# Patient Record
Sex: Female | Born: 1960 | Race: White | Hispanic: No | Marital: Married | Smoking: Never smoker
Health system: Southern US, Community
[De-identification: ages and names within clinical notes are randomized; demographics above are authoritative.]

## PROBLEM LIST (undated history)

## (undated) DIAGNOSIS — A6000 Herpesviral infection of urogenital system, unspecified: Secondary | ICD-10-CM

## (undated) DIAGNOSIS — Z803 Family history of malignant neoplasm of breast: Secondary | ICD-10-CM

## (undated) DIAGNOSIS — C50911 Malignant neoplasm of unspecified site of right female breast: Secondary | ICD-10-CM

## (undated) DIAGNOSIS — M8580 Other specified disorders of bone density and structure, unspecified site: Secondary | ICD-10-CM

## (undated) DIAGNOSIS — K589 Irritable bowel syndrome without diarrhea: Secondary | ICD-10-CM

## (undated) DIAGNOSIS — K760 Fatty (change of) liver, not elsewhere classified: Secondary | ICD-10-CM

## (undated) DIAGNOSIS — J45909 Unspecified asthma, uncomplicated: Secondary | ICD-10-CM

## (undated) DIAGNOSIS — Z780 Asymptomatic menopausal state: Secondary | ICD-10-CM

## (undated) DIAGNOSIS — I10 Essential (primary) hypertension: Secondary | ICD-10-CM

## (undated) DIAGNOSIS — E7800 Pure hypercholesterolemia, unspecified: Secondary | ICD-10-CM

## (undated) DIAGNOSIS — F411 Generalized anxiety disorder: Secondary | ICD-10-CM

## (undated) DIAGNOSIS — M810 Age-related osteoporosis without current pathological fracture: Secondary | ICD-10-CM

## (undated) DIAGNOSIS — K219 Gastro-esophageal reflux disease without esophagitis: Secondary | ICD-10-CM

## (undated) DIAGNOSIS — E039 Hypothyroidism, unspecified: Secondary | ICD-10-CM

## (undated) HISTORY — DX: Herpesviral infection of urogenital system, unspecified: A60.00

## (undated) HISTORY — PX: NECK SURGERY: SHX720

## (undated) HISTORY — DX: Pure hypercholesterolemia, unspecified: E78.00

## (undated) HISTORY — DX: Irritable bowel syndrome, unspecified: K58.9

## (undated) HISTORY — DX: Unspecified asthma, uncomplicated: J45.909

## (undated) HISTORY — PX: ESOPHAGOGASTRODUODENOSCOPY: SHX1529

## (undated) HISTORY — DX: Generalized anxiety disorder: F41.1

## (undated) HISTORY — DX: Age-related osteoporosis without current pathological fracture: M81.0

## (undated) HISTORY — DX: Hypothyroidism, unspecified: E03.9

## (undated) HISTORY — PX: BREAST CYST ASPIRATION: SHX578

## (undated) HISTORY — DX: Gastro-esophageal reflux disease without esophagitis: K21.9

## (undated) HISTORY — DX: Family history of malignant neoplasm of breast: Z80.3

## (undated) HISTORY — DX: Asymptomatic menopausal state: Z78.0

## (undated) HISTORY — DX: Other specified disorders of bone density and structure, unspecified site: M85.80

## (undated) HISTORY — DX: Malignant neoplasm of unspecified site of right female breast: C50.911

## (undated) HISTORY — DX: Fatty (change of) liver, not elsewhere classified: K76.0

---

## 2003-02-01 ENCOUNTER — Ambulatory Visit (HOSPITAL_COMMUNITY): Admission: RE | Admit: 2003-02-01 | Discharge: 2003-02-01 | Payer: Self-pay | Admitting: Internal Medicine

## 2006-01-08 ENCOUNTER — Ambulatory Visit: Payer: Self-pay | Admitting: Unknown Physician Specialty

## 2006-01-21 ENCOUNTER — Ambulatory Visit: Payer: Self-pay | Admitting: Unknown Physician Specialty

## 2007-04-12 ENCOUNTER — Emergency Department: Payer: Self-pay | Admitting: Emergency Medicine

## 2007-08-06 ENCOUNTER — Ambulatory Visit (HOSPITAL_COMMUNITY): Admission: RE | Admit: 2007-08-06 | Discharge: 2007-08-06 | Payer: Self-pay | Admitting: Otolaryngology

## 2008-04-16 ENCOUNTER — Encounter: Admission: RE | Admit: 2008-04-16 | Discharge: 2008-04-16 | Payer: Self-pay | Admitting: Psychiatry

## 2009-04-27 ENCOUNTER — Encounter: Payer: Self-pay | Admitting: Allergy and Immunology

## 2009-05-01 ENCOUNTER — Encounter: Payer: Self-pay | Admitting: Allergy and Immunology

## 2009-06-01 ENCOUNTER — Encounter: Payer: Self-pay | Admitting: Allergy and Immunology

## 2009-07-01 ENCOUNTER — Encounter: Payer: Self-pay | Admitting: Allergy and Immunology

## 2009-11-14 ENCOUNTER — Ambulatory Visit: Payer: Self-pay | Admitting: Unknown Physician Specialty

## 2010-03-03 HISTORY — PX: COLONOSCOPY: SHX174
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
## 2010-11-13 ENCOUNTER — Ambulatory Visit: Payer: Self-pay | Admitting: Unknown Physician Specialty

## 2010-11-28 ENCOUNTER — Ambulatory Visit: Payer: Self-pay | Admitting: Unknown Physician Specialty

## 2010-11-28 LAB — BASIC METABOLIC PANEL
BUN: 11
CO2: 28
Calcium: 10.4
Chloride: 103
Creatinine, Ser: 0.94
GFR calc Af Amer: >60
GFR calc non Af Amer: >60
Glucose, Bld: 87
Potassium: 4.8
Sodium: 137

## 2010-11-28 LAB — CBC
HCT: 33.8 — ABNORMAL LOW
Hemoglobin: 11.9 — ABNORMAL LOW
MCHC: 35.3
MCV: 83.8
Platelets: 346
RBC: 4.04
RDW: 13.1
WBC: 5

## 2011-04-02 ENCOUNTER — Ambulatory Visit: Payer: Self-pay | Admitting: Internal Medicine

## 2011-04-09 ENCOUNTER — Other Ambulatory Visit: Payer: Self-pay | Admitting: Gastroenterology

## 2011-04-09 LAB — HCG, QUANTITATIVE, PREGNANCY: Beta Hcg, Quant.: 1 m[IU]/mL

## 2011-04-10 ENCOUNTER — Ambulatory Visit: Payer: Self-pay | Admitting: Gastroenterology

## 2014-10-13 ENCOUNTER — Other Ambulatory Visit: Payer: Self-pay | Admitting: Internal Medicine

## 2014-10-13 ENCOUNTER — Ambulatory Visit
Admission: RE | Admit: 2014-10-13 | Discharge: 2014-10-13 | Disposition: A | Discharge status: Home / Self Care | Payer: BC Managed Care – PPO | Source: Ambulatory Visit | Attending: Internal Medicine | Admitting: Internal Medicine

## 2014-10-13 DIAGNOSIS — R05 Cough: Secondary | ICD-10-CM

## 2014-10-13 DIAGNOSIS — R059 Cough, unspecified: Secondary | ICD-10-CM

## 2014-10-13 DIAGNOSIS — J45909 Unspecified asthma, uncomplicated: Secondary | ICD-10-CM | POA: Diagnosis not present

## 2014-10-13 DIAGNOSIS — R0989 Other specified symptoms and signs involving the circulatory and respiratory systems: Secondary | ICD-10-CM | POA: Diagnosis present

## 2015-12-22 ENCOUNTER — Emergency Department: Payer: BC Managed Care – PPO

## 2015-12-22 ENCOUNTER — Encounter: Payer: Self-pay | Admitting: Emergency Medicine

## 2015-12-22 ENCOUNTER — Emergency Department
Admission: EM | Admit: 2015-12-22 | Discharge: 2015-12-22 | Disposition: A | Discharge status: Home / Self Care | Payer: BC Managed Care – PPO | Attending: Emergency Medicine | Admitting: Emergency Medicine

## 2015-12-22 DIAGNOSIS — S161XXA Strain of muscle, fascia and tendon at neck level, initial encounter: Secondary | ICD-10-CM

## 2015-12-22 DIAGNOSIS — S39012A Strain of muscle, fascia and tendon of lower back, initial encounter: Secondary | ICD-10-CM | POA: Diagnosis not present

## 2015-12-22 DIAGNOSIS — Y92410 Unspecified street and highway as the place of occurrence of the external cause: Secondary | ICD-10-CM | POA: Insufficient documentation

## 2015-12-22 DIAGNOSIS — Y999 Unspecified external cause status: Secondary | ICD-10-CM | POA: Diagnosis not present

## 2015-12-22 DIAGNOSIS — R1084 Generalized abdominal pain: Secondary | ICD-10-CM

## 2015-12-22 DIAGNOSIS — Y939 Activity, unspecified: Secondary | ICD-10-CM | POA: Insufficient documentation

## 2015-12-22 DIAGNOSIS — S169XXA Unspecified injury of muscle, fascia and tendon at neck level, initial encounter: Secondary | ICD-10-CM | POA: Diagnosis present

## 2015-12-22 DIAGNOSIS — I10 Essential (primary) hypertension: Secondary | ICD-10-CM | POA: Diagnosis not present

## 2015-12-22 DIAGNOSIS — G8911 Acute pain due to trauma: Secondary | ICD-10-CM

## 2015-12-22 HISTORY — DX: Essential (primary) hypertension: I10

## 2015-12-22 LAB — COMPREHENSIVE METABOLIC PANEL WITH GFR
ALT: 14 U/L (ref 14–54)
AST: 19 U/L (ref 15–41)
Albumin: 3.9 g/dL (ref 3.5–5.0)
Alkaline Phosphatase: 71 U/L (ref 38–126)
Anion gap: 7 (ref 5–15)
BUN: 17 mg/dL (ref 6–20)
CO2: 27 mmol/L (ref 22–32)
Calcium: 9.3 mg/dL (ref 8.9–10.3)
Chloride: 100 mmol/L — ABNORMAL LOW (ref 101–111)
Creatinine, Ser: 0.98 mg/dL (ref 0.44–1.00)
GFR calc Af Amer: >60 mL/min
GFR calc non Af Amer: >60 mL/min
Glucose, Bld: 92 mg/dL (ref 65–99)
Potassium: 3.6 mmol/L (ref 3.5–5.1)
Sodium: 134 mmol/L — ABNORMAL LOW (ref 135–145)
Total Bilirubin: 0.7 mg/dL (ref 0.3–1.2)
Total Protein: 7.5 g/dL (ref 6.5–8.1)

## 2015-12-22 LAB — CBC WITH DIFFERENTIAL/PLATELET
Basophils Absolute: 0 K/uL (ref 0–0.1)
Basophils Relative: 1 %
Eosinophils Absolute: 0.3 K/uL (ref 0–0.7)
Eosinophils Relative: 5 %
HCT: 37.1 % (ref 35.0–47.0)
Hemoglobin: 12.6 g/dL (ref 12.0–16.0)
Lymphocytes Relative: 28 %
Lymphs Abs: 1.7 K/uL (ref 1.0–3.6)
MCH: 28.4 pg (ref 26.0–34.0)
MCHC: 33.9 g/dL (ref 32.0–36.0)
MCV: 84 fL (ref 80.0–100.0)
Monocytes Absolute: 0.6 K/uL (ref 0.2–0.9)
Monocytes Relative: 10 %
Neutro Abs: 3.4 K/uL (ref 1.4–6.5)
Neutrophils Relative %: 56 %
Platelets: 317 K/uL (ref 150–440)
RBC: 4.42 MIL/uL (ref 3.80–5.20)
RDW: 13.4 % (ref 11.5–14.5)
WBC: 6 K/uL (ref 3.6–11.0)

## 2015-12-22 MED ORDER — IOPAMIDOL (ISOVUE-300) INJECTION 61%
100.0000 mL | Freq: Once | INTRAVENOUS | Status: AC | PRN
  Administered 2015-12-22: 100 mL via INTRAVENOUS
  Filled 2015-12-22: qty 100

## 2015-12-22 MED ORDER — NAPROXEN 500 MG PO TABS: 500.0000 mg | ORAL_TABLET | Freq: Two times a day (BID) | ORAL | 0 refills | Status: DC

## 2015-12-22 MED ORDER — DIAZEPAM 2 MG PO TABS: 2.0000 mg | ORAL_TABLET | Freq: Three times a day (TID) | ORAL | 0 refills | Status: DC | PRN
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
## 2015-12-22 NOTE — Discharge Instructions (Signed)
Call and schedule a follow up appointment with Dr. Hall Busing. Return to the ER immediately for any symptom that changes or worsens or for new concerns if you are unable to schedule an appointment.

## 2015-12-22 NOTE — ED Notes (Signed)
Patient transported to X-ray 

## 2016-02-29 ENCOUNTER — Other Ambulatory Visit: Payer: Self-pay | Admitting: Certified Nurse Midwife

## 2016-02-29 DIAGNOSIS — Z1239 Encounter for other screening for malignant neoplasm of breast: Secondary | ICD-10-CM

## 2016-02-29 DIAGNOSIS — Z13820 Encounter for screening for osteoporosis: Secondary | ICD-10-CM

## 2016-03-03 DIAGNOSIS — C50911 Malignant neoplasm of unspecified site of right female breast: Secondary | ICD-10-CM

## 2016-03-03 HISTORY — DX: Malignant neoplasm of unspecified site of right female breast: C50.911

## 2016-03-24 ENCOUNTER — Other Ambulatory Visit: Payer: Self-pay | Admitting: Nurse Practitioner

## 2016-03-24 DIAGNOSIS — R1013 Epigastric pain: Secondary | ICD-10-CM

## 2016-03-24 DIAGNOSIS — R112 Nausea with vomiting, unspecified: Secondary | ICD-10-CM

## 2016-03-28 ENCOUNTER — Ambulatory Visit
Admission: RE | Admit: 2016-03-28 | Discharge: 2016-03-28 | Disposition: A | Discharge status: Home / Self Care | Payer: BC Managed Care – PPO | Source: Ambulatory Visit | Attending: Nurse Practitioner | Admitting: Nurse Practitioner

## 2016-03-28 DIAGNOSIS — R112 Nausea with vomiting, unspecified: Secondary | ICD-10-CM | POA: Diagnosis present

## 2016-03-28 DIAGNOSIS — R1013 Epigastric pain: Secondary | ICD-10-CM

## 2016-03-28 DIAGNOSIS — K7689 Other specified diseases of liver: Secondary | ICD-10-CM | POA: Diagnosis not present

## 2016-03-28 DIAGNOSIS — N281 Cyst of kidney, acquired: Secondary | ICD-10-CM | POA: Insufficient documentation

## 2016-03-28 MED ORDER — TECHNETIUM TC 99M MEBROFENIN IV KIT
5.0000 | PACK | Freq: Once | INTRAVENOUS | Status: AC | PRN
  Administered 2016-03-28: 5.3 via INTRAVENOUS

## 2016-04-08 ENCOUNTER — Ambulatory Visit: Payer: BC Managed Care – PPO

## 2016-04-08 ENCOUNTER — Other Ambulatory Visit: Payer: BC Managed Care – PPO

## 2016-04-15 ENCOUNTER — Ambulatory Visit: Payer: BC Managed Care – PPO

## 2016-05-22 ENCOUNTER — Ambulatory Visit
Admission: RE | Admit: 2016-05-22 | Discharge: 2016-05-22 | Disposition: A | Discharge status: Home / Self Care | Payer: BC Managed Care – PPO | Source: Ambulatory Visit | Attending: Certified Nurse Midwife | Admitting: Certified Nurse Midwife

## 2016-05-22 DIAGNOSIS — M8580 Other specified disorders of bone density and structure, unspecified site: Secondary | ICD-10-CM | POA: Diagnosis not present

## 2016-05-22 DIAGNOSIS — Z1239 Encounter for other screening for malignant neoplasm of breast: Secondary | ICD-10-CM

## 2016-05-22 DIAGNOSIS — Z13820 Encounter for screening for osteoporosis: Secondary | ICD-10-CM | POA: Diagnosis present

## 2016-05-24 ENCOUNTER — Telehealth: Payer: Self-pay | Admitting: Certified Nurse Midwife

## 2016-05-24 NOTE — Telephone Encounter (Signed)
Patient called with DEXA results. T score of spine was -0.9 and of femur T=-1.4. No further action needed except adequate calcium and vitamin D intake and exercise.

## 2016-05-29 ENCOUNTER — Inpatient Hospital Stay
Admission: RE | Admit: 2016-05-29 | Discharge: 2016-05-29 | Disposition: A | Discharge status: Home / Self Care | Payer: Self-pay | Source: Ambulatory Visit | Attending: *Deleted | Admitting: *Deleted

## 2016-05-29 ENCOUNTER — Other Ambulatory Visit: Payer: Self-pay | Admitting: *Deleted

## 2016-05-29 DIAGNOSIS — Z9289 Personal history of other medical treatment: Secondary | ICD-10-CM

## 2016-11-12 ENCOUNTER — Telehealth: Payer: Self-pay

## 2016-11-12 ENCOUNTER — Encounter: Payer: Self-pay | Admitting: Certified Nurse Midwife

## 2016-11-12 ENCOUNTER — Other Ambulatory Visit: Payer: Self-pay | Admitting: Certified Nurse Midwife

## 2016-11-12 DIAGNOSIS — A6000 Herpesviral infection of urogenital system, unspecified: Secondary | ICD-10-CM | POA: Insufficient documentation

## 2016-11-12 HISTORY — DX: Herpesviral infection of urogenital system, unspecified: A60.00

## 2016-11-12 MED ORDER — VALACYCLOVIR HCL 500 MG PO TABS: ORAL_TABLET | ORAL | 2 refills | Status: DC

## 2016-11-12 NOTE — Telephone Encounter (Signed)
Called patient to get current pharmacy and RX for Valtrex was sent to [facility]

## 2016-11-12 NOTE — Telephone Encounter (Signed)
Pt calling today stating she needs a refill sent to her pharm of Valtrex. Saw CG last time. Call back # [phone]

## 2016-11-12 NOTE — Telephone Encounter (Signed)
Please advise for refill. Not on current med list. Pt seen for annual 04/25/16

## 2016-12-02 HISTORY — PX: BREAST BIOPSY: SHX20

## 2016-12-30 HISTORY — PX: BREAST LUMPECTOMY WITH SENTINEL LYMPH NODE BIOPSY: SHX5597

## 2017-01-13 HISTORY — PX: RE-EXCISION OF BREAST LUMPECTOMY: SHX6048

## 2017-04-28 ENCOUNTER — Ambulatory Visit
Admission: RE | Admit: 2017-04-28 | Discharge: 2017-04-28 | Disposition: A | Discharge status: Home / Self Care | Payer: BC Managed Care – PPO | Source: Ambulatory Visit | Attending: Radiation Oncology | Admitting: Radiation Oncology

## 2017-04-28 ENCOUNTER — Other Ambulatory Visit: Payer: Self-pay

## 2017-04-28 ENCOUNTER — Encounter: Payer: Self-pay | Admitting: Radiation Oncology

## 2017-04-28 VITALS — BP 123/86 | HR 75 | Temp 97.0°F | Resp 18 | Wt 172.2 lb

## 2017-04-28 DIAGNOSIS — Z170 Estrogen receptor positive status [ER+]: Secondary | ICD-10-CM | POA: Diagnosis not present

## 2017-04-28 DIAGNOSIS — G629 Polyneuropathy, unspecified: Secondary | ICD-10-CM | POA: Insufficient documentation

## 2017-04-28 DIAGNOSIS — I10 Essential (primary) hypertension: Secondary | ICD-10-CM | POA: Diagnosis not present

## 2017-04-28 DIAGNOSIS — Z803 Family history of malignant neoplasm of breast: Secondary | ICD-10-CM | POA: Diagnosis not present

## 2017-04-28 DIAGNOSIS — C50511 Malignant neoplasm of lower-outer quadrant of right female breast: Secondary | ICD-10-CM | POA: Insufficient documentation

## 2017-04-28 DIAGNOSIS — Z79899 Other long term (current) drug therapy: Secondary | ICD-10-CM | POA: Diagnosis not present

## 2017-04-28 NOTE — Consult Note (Signed)
NEW PATIENT EVALUATION  Name: Sara Woodard  MRN: [id number]  Date:   04/28/2017     DOB: 21-Dec-1960   This 57 y.o. 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  REFERRING PHYSICIAN: Albina Billet, MD  CHIEF COMPLAINT:  Chief Complaint  Patient presents with  . Breast Cancer    Initial Evaluation    DIAGNOSIS: The encounter diagnosis was Malignant neoplasm of lower-outer quadrant of right breast of female, estrogen receptor positive ([place]).   PREVIOUS INVESTIGATIONS:  Pathology reports reviewed Mammogram and ultrasound and MRI scans reviewed Clinical notes reviewed  HPI: Patient is a 57 year old female of [place] descent who was referred to the [facility] breast risk assessment clinic based on her Jewish ancestry. She had genetic testing back in 2017 negative for the BRCA gene. Bilateral MRIs in September 2018 showed an irregular enhancing mass in the posterior aspect of the right breast at the 6:00 position measuring 1.4 x 1.2 cm. She underwent ultrasound-guided biopsy which was negative followed by MRI guided biopsy in October 2018 this time showing grade 3 invasive mammary carcinoma with ductal carcinoma in situ and atypical lobular hyperplasia. Tumor was ER/PR HER-2/neu overexpressed. 12/30/2016 she had a wide local excision and sentinel node biopsy. She had a grade 21.1 cm lesion with positive margins. One of 2 lymph nodes was positive for isolated tumor cells. She had her reexcision November 2018 with negative pathology she was started on Taxol and Herceptin and will complete Taxol therapy this week. She is developed a slight rash. 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 She specifically denies breast tenderness cough or bone pain. She is  now referred to radiation oncology for consideration of treatment.  PLANNED TREATMENT REGIMEN: Right whole breast and peripheral lymphatic radiation  PAST MEDICAL HISTORY:  has a past medical history of Genital herpes (11/12/2016) and Hypertension.    PAST SURGICAL HISTORY:  Past Surgical History:  Procedure Laterality Date  . BREAST CYST ASPIRATION      FAMILY HISTORY: family history includes Breast cancer (age of onset: 90) in her sister.  SOCIAL HISTORY:  reports that  has never smoked. She does not have any smokeless tobacco history on file. She reports that she does not drink alcohol.  ALLERGIES: Patient has no known allergies.  MEDICATIONS:  Current Outpatient Medications  Medication Sig Dispense Refill  . albuterol (PROAIR HFA) 108 (90 Base) MCG/ACT inhaler 90 mcg/Actuation  prn as needed    . DULoxetine (CYMBALTA) 60 MG capsule Take by mouth.    . EPINEPHrine (EPIPEN 2-PAK) 0.3 mg/0.3 mL IJ SOAJ injection     . hydrOXYzine (ATARAX/VISTARIL) 10 MG tablet Take by mouth.    . lidocaine-prilocaine (EMLA) cream APPLY TOPICALLY ONCE APPLY TO PORT 30 MINUTES TO 1 HOUR PRIOR TO PORT ACCESS.    . triamcinolone cream (KENALOG) 0.5 % Apply topically.    Marland Kitchen zolmitriptan (ZOMIG) 5 MG tablet Take 1 tablet at onset of headache. May repeat after 2 hours until relief up to a maximum of 2 tablets in 24 hours as needed    . atenolol (TENORMIN) 25 MG tablet Take 25 mg by mouth daily.  3  . diazepam (VALIUM) 2 MG tablet Take 1 tablet (2 mg total) by mouth every 8 (eight) hours as needed. 12 tablet  0  . dicyclomine (BENTYL) 20 MG tablet TAKE 1 TABLET (20 MG TOTAL) BY MOUTH 2 (TWO) TIMES DAILY AS NEEDED  1  . levothyroxine (SYNTHROID, LEVOTHROID) 88 MCG tablet Take 88 mcg by mouth daily.  5  . LORazepam (ATIVAN) 1 MG tablet Take 1 mg by mouth at bedtime as needed. for sleep  0  . naproxen (NAPROSYN) 500 MG tablet Take 1 tablet (500 mg total) by mouth 2 (two) times daily with a meal. 30 tablet 0  .  ondansetron (ZOFRAN) 8 MG tablet Take by mouth.    . pantoprazole (PROTONIX) 40 MG tablet Take 40 mg by mouth daily.  3  . PAZEO 0.7 % SOLN INSTILL 1 DROP IN BOTH EYES DAILY FOR 14 DAYS AS NEEDED FOR ALLERGIES  2  . tiZANidine (ZANAFLEX) 2 MG tablet Take 2 mg by mouth 2 (two) times daily.  3  . valACYclovir (VALTREX) 500 MG tablet Take one BID x 3-5 days prn outbreak 30 tablet 2   No current facility-administered medications for this encounter.     ECOG PERFORMANCE STATUS:  0 - Asymptomatic  REVIEW OF SYSTEMS:  Patient denies any weight loss, fatigue, weakness, fever, chills or night sweats. Patient denies any loss of vision, blurred vision. Patient denies any ringing  of the ears or hearing loss. No irregular heartbeat. Patient denies heart murmur or history of fainting. Patient denies any chest pain or pain radiating to her upper extremities. Patient denies any shortness of breath, difficulty breathing at night, cough or hemoptysis. Patient denies any swelling in the lower legs. Patient denies any nausea vomiting, vomiting of blood, or coffee ground material in the vomitus. Patient denies any stomach pain. Patient states has had normal bowel movements no significant constipation or diarrhea. Patient denies any dysuria, hematuria or significant nocturia. Patient denies any problems walking, swelling in the joints or loss of balance. Patient denies any skin changes, loss of hair or loss of weight. Patient denies any excessive worrying or anxiety or significant depression. Patient denies any problems with insomnia. Patient denies excessive thirst, polyuria, polydipsia. Patient denies any swollen glands, patient denies easy bruising or easy bleeding. Patient denies any recent infections, allergies or URI. Patient "s visual fields have not changed significantly in recent time.    PHYSICAL EXAM: BP 123/86   Pulse 75   Temp (!) 97 F (36.1 C)   Resp 18   Wt 172 lb 2.9 oz (78.1 kg)   BMI 29.55 kg/m   She status post wide local excision in the medial portion of the right breast incision is well-healed. No dominant mass or nodularity is noted in either breast in 2 positions examined. No axillary or supraclavicular adenopathy is appreciated. She has a Port-A-Cath placed in her left anterior chest wall. Well-developed well-nourished patient in NAD. HEENT reveals PERLA, EOMI, discs not visualized.  Oral cavity is clear. No oral mucosal lesions are identified. Neck is clear without evidence of cervical or supraclavicular adenopathy. Lungs are clear to A&P. Cardiac examination is essentially unremarkable with regular rate and rhythm without murmur rub or thrill. Abdomen is benign with no organomegaly or masses noted. Motor sensory and DTR levels are equal and symmetric in the upper and lower extremities. Cranial nerves II through XII are grossly intact. Proprioception is intact. No peripheral adenopathy or edema is identified. No motor or sensory levels are noted. Crude visual fields are within normal range.  LABORATORY DATA: Pathology reports reviewed    RADIOLOGY RESULTS: MRI scans mammograms  and ultrasound all reviewed and compatible with the above-stated findings   IMPRESSION: T1c N0 (I plus) M0 invasive mammary carcinoma triple positive of the right breast status post wide local excision and sentinel node biopsy in 57 year old female  PLAN: At this time I have recommended whole breast and peripheral lymphatic radiation. Will bring both his areas to 5040 cGy in 28 fractions. I would also boost her scar another 1600 cGy based on the initial close and positive margin. Risks and benefits of treatment including skin reaction fatigue alteration of blood counts possible inclusion of superficial lung slight chance of lymphedema of her right upper extremity all were discussed in detail with the patient. She seems to comprehend my treatment plan well. I personally set up and ordered CT simulation in about a  week's time.There will be extra effort by both professional staff as well as technical staff to coordinate and manage concurrent chemoradiation and ensuing side effects during her treatments. Patient also will be candidate for antiestrogen therapy after completion of radiation. Patient also will be continued on Herceptin throughout her treatments. Patient and husband both seem to comprehend my treatment plan well.  I would like to take this opportunity to thank you for allowing me to participate in the care of your patient.Noreene Filbert, MD

## 2017-05-05 ENCOUNTER — Encounter: Payer: Self-pay | Admitting: Radiation Oncology

## 2017-05-11 ENCOUNTER — Ambulatory Visit
Admission: RE | Admit: 2017-05-11 | Discharge: 2017-05-11 | Disposition: A | Discharge status: Home / Self Care | Payer: BC Managed Care – PPO | Source: Ambulatory Visit | Attending: Radiation Oncology | Admitting: Radiation Oncology

## 2017-05-11 DIAGNOSIS — Z510 Encounter for antineoplastic radiation therapy: Secondary | ICD-10-CM | POA: Diagnosis present

## 2017-05-11 DIAGNOSIS — C50511 Malignant neoplasm of lower-outer quadrant of right female breast: Secondary | ICD-10-CM | POA: Insufficient documentation

## 2017-05-11 DIAGNOSIS — Z170 Estrogen receptor positive status [ER+]: Secondary | ICD-10-CM | POA: Insufficient documentation

## 2017-05-14 ENCOUNTER — Other Ambulatory Visit: Payer: Self-pay | Admitting: *Deleted

## 2017-05-14 DIAGNOSIS — C50511 Malignant neoplasm of lower-outer quadrant of right female breast: Secondary | ICD-10-CM

## 2017-05-15 DIAGNOSIS — Z510 Encounter for antineoplastic radiation therapy: Secondary | ICD-10-CM | POA: Diagnosis not present

## 2017-05-18 ENCOUNTER — Ambulatory Visit
Admission: RE | Admit: 2017-05-18 | Discharge: 2017-05-18 | Disposition: A | Discharge status: Home / Self Care | Payer: BC Managed Care – PPO | Source: Ambulatory Visit | Attending: Radiation Oncology | Admitting: Radiation Oncology

## 2017-05-18 DIAGNOSIS — Z510 Encounter for antineoplastic radiation therapy: Secondary | ICD-10-CM | POA: Diagnosis not present

## 2017-05-19 ENCOUNTER — Ambulatory Visit
Admission: RE | Admit: 2017-05-19 | Discharge: 2017-05-19 | Disposition: A | Discharge status: Home / Self Care | Payer: BC Managed Care – PPO | Source: Ambulatory Visit | Attending: Radiation Oncology | Admitting: Radiation Oncology

## 2017-05-19 DIAGNOSIS — Z510 Encounter for antineoplastic radiation therapy: Secondary | ICD-10-CM | POA: Diagnosis not present

## 2017-05-20 ENCOUNTER — Ambulatory Visit
Admission: RE | Admit: 2017-05-20 | Discharge: 2017-05-20 | Disposition: A | Discharge status: Home / Self Care | Payer: BC Managed Care – PPO | Source: Ambulatory Visit | Attending: Radiation Oncology | Admitting: Radiation Oncology

## 2017-05-20 DIAGNOSIS — Z510 Encounter for antineoplastic radiation therapy: Secondary | ICD-10-CM | POA: Diagnosis not present

## 2017-05-21 ENCOUNTER — Ambulatory Visit
Admission: RE | Admit: 2017-05-21 | Discharge: 2017-05-21 | Disposition: A | Discharge status: Home / Self Care | Payer: BC Managed Care – PPO | Source: Ambulatory Visit | Attending: Radiation Oncology | Admitting: Radiation Oncology

## 2017-05-21 DIAGNOSIS — Z510 Encounter for antineoplastic radiation therapy: Secondary | ICD-10-CM | POA: Diagnosis not present

## 2017-05-22 ENCOUNTER — Ambulatory Visit
Admission: RE | Admit: 2017-05-22 | Discharge: 2017-05-22 | Disposition: A | Discharge status: Home / Self Care | Payer: BC Managed Care – PPO | Source: Ambulatory Visit | Attending: Radiation Oncology | Admitting: Radiation Oncology

## 2017-05-22 ENCOUNTER — Other Ambulatory Visit: Payer: Self-pay | Admitting: Certified Nurse Midwife

## 2017-05-22 DIAGNOSIS — Z510 Encounter for antineoplastic radiation therapy: Secondary | ICD-10-CM | POA: Diagnosis not present

## 2017-05-22 NOTE — Telephone Encounter (Signed)
Please advise for refill. Pt past due for annual. Thank you.

## 2017-05-25 ENCOUNTER — Ambulatory Visit
Admission: RE | Admit: 2017-05-25 | Discharge: 2017-05-25 | Disposition: A | Discharge status: Home / Self Care | Payer: BC Managed Care – PPO | Source: Ambulatory Visit | Attending: Radiation Oncology | Admitting: Radiation Oncology

## 2017-05-25 DIAGNOSIS — Z510 Encounter for antineoplastic radiation therapy: Secondary | ICD-10-CM | POA: Diagnosis not present

## 2017-05-26 ENCOUNTER — Ambulatory Visit
Admission: RE | Admit: 2017-05-26 | Discharge: 2017-05-26 | Disposition: A | Discharge status: Home / Self Care | Payer: BC Managed Care – PPO | Source: Ambulatory Visit | Attending: Radiation Oncology | Admitting: Radiation Oncology

## 2017-05-26 DIAGNOSIS — Z510 Encounter for antineoplastic radiation therapy: Secondary | ICD-10-CM | POA: Diagnosis not present

## 2017-05-27 ENCOUNTER — Ambulatory Visit
Admission: RE | Admit: 2017-05-27 | Discharge: 2017-05-27 | Disposition: A | Discharge status: Home / Self Care | Payer: BC Managed Care – PPO | Source: Ambulatory Visit | Attending: Radiation Oncology | Admitting: Radiation Oncology

## 2017-05-27 DIAGNOSIS — Z510 Encounter for antineoplastic radiation therapy: Secondary | ICD-10-CM | POA: Diagnosis not present

## 2017-05-28 ENCOUNTER — Ambulatory Visit
Admission: RE | Admit: 2017-05-28 | Discharge: 2017-05-28 | Disposition: A | Discharge status: Home / Self Care | Payer: BC Managed Care – PPO | Source: Ambulatory Visit | Attending: Radiation Oncology | Admitting: Radiation Oncology

## 2017-05-28 DIAGNOSIS — Z510 Encounter for antineoplastic radiation therapy: Secondary | ICD-10-CM | POA: Diagnosis not present

## 2017-05-29 ENCOUNTER — Ambulatory Visit
Admission: RE | Admit: 2017-05-29 | Discharge: 2017-05-29 | Disposition: A | Discharge status: Home / Self Care | Payer: BC Managed Care – PPO | Source: Ambulatory Visit | Attending: Radiation Oncology | Admitting: Radiation Oncology

## 2017-05-29 DIAGNOSIS — Z510 Encounter for antineoplastic radiation therapy: Secondary | ICD-10-CM | POA: Diagnosis not present

## 2017-06-01 ENCOUNTER — Ambulatory Visit
Admission: RE | Admit: 2017-06-01 | Discharge: 2017-06-01 | Disposition: A | Discharge status: Home / Self Care | Payer: BC Managed Care – PPO | Source: Ambulatory Visit | Attending: Radiation Oncology | Admitting: Radiation Oncology

## 2017-06-01 DIAGNOSIS — C50911 Malignant neoplasm of unspecified site of right female breast: Secondary | ICD-10-CM | POA: Insufficient documentation

## 2017-06-01 DIAGNOSIS — Z170 Estrogen receptor positive status [ER+]: Secondary | ICD-10-CM | POA: Diagnosis not present

## 2017-06-01 DIAGNOSIS — Z510 Encounter for antineoplastic radiation therapy: Secondary | ICD-10-CM | POA: Diagnosis not present

## 2017-06-02 ENCOUNTER — Ambulatory Visit
Admission: RE | Admit: 2017-06-02 | Discharge: 2017-06-02 | Disposition: A | Discharge status: Home / Self Care | Payer: BC Managed Care – PPO | Source: Ambulatory Visit | Attending: Radiation Oncology | Admitting: Radiation Oncology

## 2017-06-02 DIAGNOSIS — C50911 Malignant neoplasm of unspecified site of right female breast: Secondary | ICD-10-CM | POA: Diagnosis not present

## 2017-06-03 ENCOUNTER — Ambulatory Visit
Admission: RE | Admit: 2017-06-03 | Discharge: 2017-06-03 | Disposition: A | Discharge status: Home / Self Care | Payer: BC Managed Care – PPO | Source: Ambulatory Visit | Attending: Radiation Oncology | Admitting: Radiation Oncology

## 2017-06-03 ENCOUNTER — Inpatient Hospital Stay: Payer: BC Managed Care – PPO | Attending: Radiation Oncology

## 2017-06-03 DIAGNOSIS — C50511 Malignant neoplasm of lower-outer quadrant of right female breast: Secondary | ICD-10-CM | POA: Insufficient documentation

## 2017-06-03 DIAGNOSIS — C50911 Malignant neoplasm of unspecified site of right female breast: Secondary | ICD-10-CM | POA: Diagnosis not present

## 2017-06-03 LAB — CBC
HEMATOCRIT: 31.8 % — AB (ref 35.0–47.0)
Hemoglobin: 11 g/dL — ABNORMAL LOW (ref 12.0–16.0)
MCH: 31.4 pg (ref 26.0–34.0)
MCHC: 34.6 g/dL (ref 32.0–36.0)
MCV: 90.8 fL (ref 80.0–100.0)
PLATELETS: 315 10*3/uL (ref 150–440)
RBC: 3.51 MIL/uL — ABNORMAL LOW (ref 3.80–5.20)
RDW: 15.5 % — AB (ref 11.5–14.5)
WBC: 3.9 10*3/uL (ref 3.6–11.0)

## 2017-06-04 ENCOUNTER — Ambulatory Visit
Admission: RE | Admit: 2017-06-04 | Discharge: 2017-06-04 | Disposition: A | Discharge status: Home / Self Care | Payer: BC Managed Care – PPO | Source: Ambulatory Visit | Attending: Radiation Oncology | Admitting: Radiation Oncology

## 2017-06-04 DIAGNOSIS — C50911 Malignant neoplasm of unspecified site of right female breast: Secondary | ICD-10-CM | POA: Diagnosis not present

## 2017-06-05 ENCOUNTER — Ambulatory Visit
Admission: RE | Admit: 2017-06-05 | Discharge: 2017-06-05 | Disposition: A | Discharge status: Home / Self Care | Payer: BC Managed Care – PPO | Source: Ambulatory Visit | Attending: Radiation Oncology | Admitting: Radiation Oncology

## 2017-06-05 DIAGNOSIS — C50911 Malignant neoplasm of unspecified site of right female breast: Secondary | ICD-10-CM | POA: Diagnosis not present

## 2017-06-08 ENCOUNTER — Ambulatory Visit
Admission: RE | Admit: 2017-06-08 | Discharge: 2017-06-08 | Disposition: A | Discharge status: Home / Self Care | Payer: BC Managed Care – PPO | Source: Ambulatory Visit | Attending: Radiation Oncology | Admitting: Radiation Oncology

## 2017-06-08 DIAGNOSIS — C50911 Malignant neoplasm of unspecified site of right female breast: Secondary | ICD-10-CM | POA: Diagnosis not present

## 2017-06-09 ENCOUNTER — Ambulatory Visit
Admission: RE | Admit: 2017-06-09 | Discharge: 2017-06-09 | Disposition: A | Discharge status: Home / Self Care | Payer: BC Managed Care – PPO | Source: Ambulatory Visit | Attending: Radiation Oncology | Admitting: Radiation Oncology

## 2017-06-09 DIAGNOSIS — C50911 Malignant neoplasm of unspecified site of right female breast: Secondary | ICD-10-CM | POA: Diagnosis not present

## 2017-06-10 ENCOUNTER — Ambulatory Visit
Admission: RE | Admit: 2017-06-10 | Discharge: 2017-06-10 | Disposition: A | Discharge status: Home / Self Care | Payer: BC Managed Care – PPO | Source: Ambulatory Visit | Attending: Radiation Oncology | Admitting: Radiation Oncology

## 2017-06-10 DIAGNOSIS — C50911 Malignant neoplasm of unspecified site of right female breast: Secondary | ICD-10-CM | POA: Diagnosis not present

## 2017-06-11 ENCOUNTER — Ambulatory Visit
Admission: RE | Admit: 2017-06-11 | Discharge: 2017-06-11 | Disposition: A | Discharge status: Home / Self Care | Payer: BC Managed Care – PPO | Source: Ambulatory Visit | Attending: Radiation Oncology | Admitting: Radiation Oncology

## 2017-06-11 DIAGNOSIS — C50911 Malignant neoplasm of unspecified site of right female breast: Secondary | ICD-10-CM | POA: Diagnosis not present

## 2017-06-12 ENCOUNTER — Ambulatory Visit
Admission: RE | Admit: 2017-06-12 | Discharge: 2017-06-12 | Disposition: A | Discharge status: Home / Self Care | Payer: BC Managed Care – PPO | Source: Ambulatory Visit | Attending: Radiation Oncology | Admitting: Radiation Oncology

## 2017-06-12 DIAGNOSIS — C50911 Malignant neoplasm of unspecified site of right female breast: Secondary | ICD-10-CM | POA: Diagnosis not present

## 2017-06-15 ENCOUNTER — Ambulatory Visit
Admission: RE | Admit: 2017-06-15 | Discharge: 2017-06-15 | Disposition: A | Discharge status: Home / Self Care | Payer: BC Managed Care – PPO | Source: Ambulatory Visit | Attending: Radiation Oncology | Admitting: Radiation Oncology

## 2017-06-15 DIAGNOSIS — C50911 Malignant neoplasm of unspecified site of right female breast: Secondary | ICD-10-CM | POA: Diagnosis not present

## 2017-06-16 ENCOUNTER — Ambulatory Visit
Admission: RE | Admit: 2017-06-16 | Discharge: 2017-06-16 | Disposition: A | Discharge status: Home / Self Care | Payer: BC Managed Care – PPO | Source: Ambulatory Visit | Attending: Radiation Oncology | Admitting: Radiation Oncology

## 2017-06-16 DIAGNOSIS — C50911 Malignant neoplasm of unspecified site of right female breast: Secondary | ICD-10-CM | POA: Diagnosis not present

## 2017-06-17 ENCOUNTER — Other Ambulatory Visit: Payer: Self-pay

## 2017-06-17 ENCOUNTER — Ambulatory Visit
Admission: RE | Admit: 2017-06-17 | Discharge: 2017-06-17 | Disposition: A | Discharge status: Home / Self Care | Payer: BC Managed Care – PPO | Source: Ambulatory Visit | Attending: Radiation Oncology | Admitting: Radiation Oncology

## 2017-06-17 ENCOUNTER — Inpatient Hospital Stay: Payer: BC Managed Care – PPO

## 2017-06-17 DIAGNOSIS — C50511 Malignant neoplasm of lower-outer quadrant of right female breast: Secondary | ICD-10-CM

## 2017-06-17 DIAGNOSIS — C50911 Malignant neoplasm of unspecified site of right female breast: Secondary | ICD-10-CM | POA: Diagnosis not present

## 2017-06-17 LAB — CBC
HCT: 32.6 % — ABNORMAL LOW (ref 35.0–47.0)
Hemoglobin: 11.3 g/dL — ABNORMAL LOW (ref 12.0–16.0)
MCH: 31.1 pg (ref 26.0–34.0)
MCHC: 34.6 g/dL (ref 32.0–36.0)
MCV: 89.8 fL (ref 80.0–100.0)
PLATELETS: 300 10*3/uL (ref 150–440)
RBC: 3.63 MIL/uL — ABNORMAL LOW (ref 3.80–5.20)
RDW: 14.8 % — AB (ref 11.5–14.5)
WBC: 3.4 10*3/uL — ABNORMAL LOW (ref 3.6–11.0)

## 2017-06-18 ENCOUNTER — Ambulatory Visit
Admission: RE | Admit: 2017-06-18 | Discharge: 2017-06-18 | Disposition: A | Discharge status: Home / Self Care | Payer: BC Managed Care – PPO | Source: Ambulatory Visit | Attending: Radiation Oncology | Admitting: Radiation Oncology

## 2017-06-18 DIAGNOSIS — C50911 Malignant neoplasm of unspecified site of right female breast: Secondary | ICD-10-CM | POA: Diagnosis not present

## 2017-06-19 ENCOUNTER — Ambulatory Visit
Admission: RE | Admit: 2017-06-19 | Discharge: 2017-06-19 | Disposition: A | Discharge status: Home / Self Care | Payer: BC Managed Care – PPO | Source: Ambulatory Visit | Attending: Radiation Oncology | Admitting: Radiation Oncology

## 2017-06-19 DIAGNOSIS — C50911 Malignant neoplasm of unspecified site of right female breast: Secondary | ICD-10-CM | POA: Diagnosis not present

## 2017-06-22 ENCOUNTER — Ambulatory Visit
Admission: RE | Admit: 2017-06-22 | Discharge: 2017-06-22 | Disposition: A | Discharge status: Home / Self Care | Payer: BC Managed Care – PPO | Source: Ambulatory Visit | Attending: Radiation Oncology | Admitting: Radiation Oncology

## 2017-06-22 DIAGNOSIS — C50911 Malignant neoplasm of unspecified site of right female breast: Secondary | ICD-10-CM | POA: Diagnosis not present

## 2017-06-23 ENCOUNTER — Ambulatory Visit
Admission: RE | Admit: 2017-06-23 | Discharge: 2017-06-23 | Disposition: A | Discharge status: Home / Self Care | Payer: BC Managed Care – PPO | Source: Ambulatory Visit | Attending: Radiation Oncology | Admitting: Radiation Oncology

## 2017-06-23 DIAGNOSIS — C50911 Malignant neoplasm of unspecified site of right female breast: Secondary | ICD-10-CM | POA: Diagnosis not present

## 2017-06-24 ENCOUNTER — Ambulatory Visit
Admission: RE | Admit: 2017-06-24 | Discharge: 2017-06-24 | Disposition: A | Discharge status: Home / Self Care | Payer: BC Managed Care – PPO | Source: Ambulatory Visit | Attending: Radiation Oncology | Admitting: Radiation Oncology

## 2017-06-24 DIAGNOSIS — C50911 Malignant neoplasm of unspecified site of right female breast: Secondary | ICD-10-CM | POA: Diagnosis not present

## 2017-06-25 ENCOUNTER — Ambulatory Visit
Admission: RE | Admit: 2017-06-25 | Discharge: 2017-06-25 | Disposition: A | Discharge status: Home / Self Care | Payer: BC Managed Care – PPO | Source: Ambulatory Visit | Attending: Radiation Oncology | Admitting: Radiation Oncology

## 2017-06-25 DIAGNOSIS — C50911 Malignant neoplasm of unspecified site of right female breast: Secondary | ICD-10-CM | POA: Diagnosis not present

## 2017-06-26 ENCOUNTER — Ambulatory Visit
Admission: RE | Admit: 2017-06-26 | Discharge: 2017-06-26 | Disposition: A | Discharge status: Home / Self Care | Payer: BC Managed Care – PPO | Source: Ambulatory Visit | Attending: Radiation Oncology | Admitting: Radiation Oncology

## 2017-06-26 DIAGNOSIS — C50911 Malignant neoplasm of unspecified site of right female breast: Secondary | ICD-10-CM | POA: Diagnosis not present

## 2017-06-29 ENCOUNTER — Ambulatory Visit
Admission: RE | Admit: 2017-06-29 | Discharge: 2017-06-29 | Disposition: A | Discharge status: Home / Self Care | Payer: BC Managed Care – PPO | Source: Ambulatory Visit | Attending: Radiation Oncology | Admitting: Radiation Oncology

## 2017-06-29 DIAGNOSIS — C50911 Malignant neoplasm of unspecified site of right female breast: Secondary | ICD-10-CM | POA: Diagnosis not present

## 2017-06-30 ENCOUNTER — Ambulatory Visit
Admission: RE | Admit: 2017-06-30 | Discharge: 2017-06-30 | Disposition: A | Discharge status: Home / Self Care | Payer: BC Managed Care – PPO | Source: Ambulatory Visit | Attending: Radiation Oncology | Admitting: Radiation Oncology

## 2017-06-30 DIAGNOSIS — C50911 Malignant neoplasm of unspecified site of right female breast: Secondary | ICD-10-CM | POA: Diagnosis not present

## 2017-07-01 ENCOUNTER — Ambulatory Visit
Admission: RE | Admit: 2017-07-01 | Discharge: 2017-07-01 | Disposition: A | Discharge status: Home / Self Care | Payer: BC Managed Care – PPO | Source: Ambulatory Visit | Attending: Radiation Oncology | Admitting: Radiation Oncology

## 2017-07-01 ENCOUNTER — Inpatient Hospital Stay: Payer: BC Managed Care – PPO | Attending: Radiation Oncology

## 2017-07-01 DIAGNOSIS — C50511 Malignant neoplasm of lower-outer quadrant of right female breast: Secondary | ICD-10-CM | POA: Diagnosis present

## 2017-07-01 DIAGNOSIS — Z510 Encounter for antineoplastic radiation therapy: Secondary | ICD-10-CM | POA: Insufficient documentation

## 2017-07-01 LAB — CBC
HEMATOCRIT: 32.6 % — AB (ref 35.0–47.0)
HEMOGLOBIN: 11.3 g/dL — AB (ref 12.0–16.0)
MCH: 30.4 pg (ref 26.0–34.0)
MCHC: 34.7 g/dL (ref 32.0–36.0)
MCV: 87.8 fL (ref 80.0–100.0)
Platelets: 303 10*3/uL (ref 150–440)
RBC: 3.72 MIL/uL — ABNORMAL LOW (ref 3.80–5.20)
RDW: 14.3 % (ref 11.5–14.5)
WBC: 3.5 10*3/uL — ABNORMAL LOW (ref 3.6–11.0)

## 2017-07-02 ENCOUNTER — Ambulatory Visit
Admission: RE | Admit: 2017-07-02 | Discharge: 2017-07-02 | Disposition: A | Discharge status: Home / Self Care | Payer: BC Managed Care – PPO | Source: Ambulatory Visit | Attending: Radiation Oncology | Admitting: Radiation Oncology

## 2017-07-02 DIAGNOSIS — C50511 Malignant neoplasm of lower-outer quadrant of right female breast: Secondary | ICD-10-CM | POA: Diagnosis not present

## 2017-07-03 ENCOUNTER — Ambulatory Visit
Admission: RE | Admit: 2017-07-03 | Discharge: 2017-07-03 | Disposition: A | Discharge status: Home / Self Care | Payer: BC Managed Care – PPO | Source: Ambulatory Visit | Attending: Radiation Oncology | Admitting: Radiation Oncology

## 2017-07-03 DIAGNOSIS — C50511 Malignant neoplasm of lower-outer quadrant of right female breast: Secondary | ICD-10-CM | POA: Diagnosis not present

## 2017-07-06 ENCOUNTER — Ambulatory Visit
Admission: RE | Admit: 2017-07-06 | Discharge: 2017-07-06 | Disposition: A | Discharge status: Home / Self Care | Payer: BC Managed Care – PPO | Source: Ambulatory Visit | Attending: Radiation Oncology | Admitting: Radiation Oncology

## 2017-07-06 DIAGNOSIS — C50511 Malignant neoplasm of lower-outer quadrant of right female breast: Secondary | ICD-10-CM | POA: Diagnosis not present

## 2017-07-07 ENCOUNTER — Ambulatory Visit
Admission: RE | Admit: 2017-07-07 | Discharge: 2017-07-07 | Disposition: A | Discharge status: Home / Self Care | Payer: BC Managed Care – PPO | Source: Ambulatory Visit | Attending: Radiation Oncology | Admitting: Radiation Oncology

## 2017-07-07 DIAGNOSIS — C50511 Malignant neoplasm of lower-outer quadrant of right female breast: Secondary | ICD-10-CM | POA: Diagnosis not present

## 2017-08-19 ENCOUNTER — Encounter: Payer: Self-pay | Admitting: Radiation Oncology

## 2017-08-19 ENCOUNTER — Ambulatory Visit
Admission: RE | Admit: 2017-08-19 | Discharge: 2017-08-19 | Disposition: A | Discharge status: Home / Self Care | Payer: BC Managed Care – PPO | Source: Ambulatory Visit | Attending: Radiation Oncology | Admitting: Radiation Oncology

## 2017-08-19 ENCOUNTER — Other Ambulatory Visit: Payer: Self-pay

## 2017-08-19 VITALS — BP 123/87 | HR 66 | Temp 98.3°F | Resp 18 | Wt 173.7 lb

## 2017-08-19 DIAGNOSIS — C50911 Malignant neoplasm of unspecified site of right female breast: Secondary | ICD-10-CM | POA: Diagnosis not present

## 2017-08-19 DIAGNOSIS — Z923 Personal history of irradiation: Secondary | ICD-10-CM | POA: Diagnosis not present

## 2017-08-19 DIAGNOSIS — Z170 Estrogen receptor positive status [ER+]: Secondary | ICD-10-CM | POA: Insufficient documentation

## 2017-08-19 DIAGNOSIS — C50511 Malignant neoplasm of lower-outer quadrant of right female breast: Secondary | ICD-10-CM

## 2017-08-19 NOTE — Progress Notes (Signed)
Radiation Oncology Follow up Note  Name: Sara Woodard   Date:   08/19/2017 MRN:  [id number] DOB: 04/04/1960    This 57 y.o. female presents to the clinic today for One-month follow-up.status post external beam radiation therapy to her right breast for stage I ER/PR positive HER-2/neu positivestage I invasive mammary carcinoma  REFERRING PROVIDER: Albina Billet, MD  HPI: patient is a 57 year old female now seen out 1 month having completed whole breast radiation to her right breast and peripheral lymphatics for triple-positive stage I invasive mammary carcinoma she was treated to her peripheral lymphatics based on isolated tumor cells in her sentinel lymph nodes..she has recently been put on letrozole. She continues on Herceptin maintenance.She is otherwise doing well. She specifically denies breast tenderness cough or bone pain.  COMPLICATIONS OF TREATMENT: none  FOLLOW UP COMPLIANCE: keeps appointments   PHYSICAL EXAM:  BP 123/87   Pulse 66   Temp 98.3 F (36.8 C)   Resp 18   Wt 173 lb 11.6 oz (78.8 kg)   BMI 29.82 kg/m  Lungs are clear to A&P cardiac examination essentially unremarkable with regular rate and rhythm. No dominant mass or nodularity is noted in either breast in 2 positions examined. Incision is well-healed. No axillary or supraclavicular adenopathy is appreciated. Cosmetic result is excellent.there is some slight decreased size of her right breast which is expected from prior radiation and lumpectomy. Cosmetic result is still excellent. Well-developed well-nourished patient in NAD. HEENT reveals PERLA, EOMI, discs not visualized.  Oral cavity is clear. No oral mucosal lesions are identified. Neck is clear without evidence of cervical or supraclavicular adenopathy. Lungs are clear to A&P. Cardiac examination is essentially unremarkable with regular rate and rhythm without murmur rub or thrill. Abdomen is benign with no organomegaly or masses noted. Motor sensory and DTR levels  are equal and symmetric in the upper and lower extremities. Cranial nerves II through XII are grossly intact. Proprioception is intact. No peripheral adenopathy or edema is identified. No motor or sensory levels are noted. Crude visual fields are within normal range.  RADIOLOGY RESULTS: no current films for review  PLAN: present time patient continues [facility] with [facility]. She continues on Herceptin. She has been started on letrozole. Otherwise I'm please were overall progress. I've asked to see her back in 4-5 months for follow-up. Patient is to call with any concerns.  I would like to take this opportunity to thank you for allowing me to participate in the care of your patient.Noreene Filbert, MD

## 2018-01-22 ENCOUNTER — Other Ambulatory Visit: Payer: Self-pay

## 2018-01-22 ENCOUNTER — Ambulatory Visit
Admission: RE | Admit: 2018-01-22 | Discharge: 2018-01-22 | Disposition: A | Discharge status: Home / Self Care | Payer: BC Managed Care – PPO | Source: Ambulatory Visit | Attending: Radiation Oncology | Admitting: Radiation Oncology

## 2018-01-22 ENCOUNTER — Encounter: Payer: Self-pay | Admitting: Radiation Oncology

## 2018-01-22 DIAGNOSIS — Z170 Estrogen receptor positive status [ER+]: Secondary | ICD-10-CM | POA: Diagnosis not present

## 2018-01-22 DIAGNOSIS — Z923 Personal history of irradiation: Secondary | ICD-10-CM | POA: Diagnosis not present

## 2018-01-22 DIAGNOSIS — Z79811 Long term (current) use of aromatase inhibitors: Secondary | ICD-10-CM | POA: Insufficient documentation

## 2018-01-22 DIAGNOSIS — C50511 Malignant neoplasm of lower-outer quadrant of right female breast: Secondary | ICD-10-CM | POA: Insufficient documentation

## 2018-01-22 NOTE — Progress Notes (Signed)
Radiation Oncology Follow up Note  Name: Sara Woodard   Date:   01/22/2018 MRN:  [id number] DOB: 03-29-1960    This 57 y.o. female presents to the clinic today for six-month follow-up status post whole breast radiation to right breast for stage I ER/PR positive HER-2/neu positive invasive mammary carcinoma.  REFERRING PROVIDER: Albina Billet, MD  HPI: patient is a 57 year old female now out 6 months having her whole breast radiation to her right breast for stage I triple positive invasive mammary carcinoma. Seen today in routine follow-up she is doing well. She has an area of the previous incision was not healing well and is scheduled for revision. She is currently on Herceptin maintenance and tolerating that well..her last mammogram which I have review was back inAugust 2019 BI-RADS 2 benign  COMPLICATIONS OF TREATMENT: none  FOLLOW UP COMPLIANCE: keeps appointments   PHYSICAL EXAM:  BP (P) 134/88 (BP Location: Left Arm, Patient Position: Sitting)   Pulse (!) (P) 57   Temp (!) (P) 97.4 F (36.3 C) (Tympanic)   Wt (P) 174 lb 11.4 oz (79.2 kg)   BMI (P) 29.99 kg/m  Area of indentation retraction in the scar region which is the area that would be reexcised no other dominant mass or nodularity is noted in either breast in 2 positions examined. No axillary or supraclavicular adenopathy is identified.Well-developed well-nourished patient in NAD. HEENT reveals PERLA, EOMI, discs not visualized.  Oral cavity is clear. No oral mucosal lesions are identified. Neck is clear without evidence of cervical or supraclavicular adenopathy. Lungs are clear to A&P. Cardiac examination is essentially unremarkable with regular rate and rhythm without murmur rub or thrill. Abdomen is benign with no organomegaly or masses noted. Motor sensory and DTR levels are equal and symmetric in the upper and lower extremities. Cranial nerves II through XII are grossly intact. Proprioception is intact. No peripheral adenopathy  or edema is identified. No motor or sensory levels are noted. Crude visual fields are within normal range.  RADIOLOGY RESULTS: mammographic reports reviewed  PLAN: present time she is doing well with no evidence of disease. She's plan for reexcision of her lumpectomy site secondary to continued expression of necrotic material. She also continues on Herceptin maintenance. I'm otherwise please were overall progress. I've asked to see her back in 1 year for follow-up. Patient is to call with any concerns at any time.she continues close follow-up and treatment care by medical oncology.  I would like to take this opportunity to thank you for allowing me to participate in the care of your patient.Noreene Filbert, MD

## 2018-02-15 ENCOUNTER — Ambulatory Visit: Payer: Self-pay | Admitting: Certified Nurse Midwife

## 2018-03-18 NOTE — Progress Notes (Signed)
Gynecology Annual Exam  PCP: Albina Billet, MD  Chief Complaint:  Chief Complaint  Patient presents with  . Gynecologic Exam    No complaints    History of Present Illness:Sara Woodard presents today for her annual exam. She is a 58 year old Caucasian/White female, G2 P2011, whose LMP was 01/31/2010. Since her last visit, she was diagnosed with invasive ductal carcinoma of the right breast, ER+, PR +, Her 2 +, grade 3. She was treated with radiation, Taxol, and Herceptin. No longer on Taxol, and her last Herceptin will be 5 Feb.She also taking Arimidex. Dr Bari Mantis is her oncologist at [facility] and Dr Silvio Pate is her surgeon.   Her menses are absent and she is postmenopausal. She does have hot flashes/ night sweats and vaginal dryness. Uses lubricants with some relief.  She has had no spotting.    The patient's past medical history is also notable for a history of asthma, hypothyroidism, hypertension, IBS, fatty liver, anxiety, GERD, and genital herpes,.    Her most recent pap smear was obtained 12/ 28/2017 and was NIL Her most recent mammogram obtained on 10/14/2017 was [facility]. There is a positive history of breast cancer in her sister and cousin. Genetic testing has been done. She tested negative for MYRISK but her lifetime risk of breast cancer was 54% by MYRISK and 25% by the TC model. She was referred to high risk breast clinic at [facility] and the first MRI study she had in 2018 was abnormal. Biopsy was then done and was positive for invasive breast cancer.  There is no family history of ovarian cancer. The patient does not do monthly self breast exams.  She had a colonoscopy 06/03/2016 was negative She had a DEXA scan 07/09/2017 which revealed osteopenia (T score -1.5, femur neck and -1.8 of spine). She receives Prolia IV every 6 months.  The patient does not smoke.  The patient does drink infrequently.  The patient does not use illegal drugs.  The patient does not exercise. She is starting  to walk with her husband. The patient may get adequate calcium in her diet. She does not get adequate vitamin D in her diet. She had a recent cholesterol screen in 2019 by Dr Hall Busing that was borderline.    Review of Systems: Review of Systems  Constitutional: Negative for chills, fever and weight loss.  HENT: Negative for congestion, sinus pain and sore throat.   Eyes: Negative for blurred vision and pain.  Respiratory: Negative for hemoptysis, shortness of breath and wheezing.   Cardiovascular: Negative for chest pain, palpitations and leg swelling.  Gastrointestinal: Negative for abdominal pain, blood in stool, diarrhea, heartburn, nausea and vomiting.  Genitourinary: Negative for dysuria, frequency, hematuria and urgency.  Musculoskeletal: Negative for back pain, joint pain and myalgias.       Positive for muscle weakness  Skin: Negative for itching and rash.  Neurological: Positive for dizziness and tingling. Negative for headaches.  Endo/Heme/Allergies: Positive for environmental allergies. Negative for polydipsia. Does not bruise/bleed easily.       Positive for hot flashes   Psychiatric/Behavioral: Negative for depression. The patient is not nervous/anxious and does not have insomnia.     Past Medical History:  Past Medical History:  Diagnosis Date  . Asthma   . Esophageal reflux   . Family history of breast cancer   . Fatty liver   . GAD (generalized anxiety disorder)   . Genital herpes 11/12/2016  . Hypercholesterolemia   .  Hypertension   . Hypothyroidism (acquired)   . Invasive ductal carcinoma of breast, female, right ([place]) 2018  . Irritable bowel syndrome (IBS)     Past Surgical History:  Past Surgical History:  Procedure Laterality Date  . BREAST BIOPSY Right 12/02/2016   invasive ductal carcinoma  . BREAST CYST ASPIRATION    . BREAST LUMPECTOMY WITH SENTINEL LYMPH NODE BIOPSY Right 12/30/2016   positive margins  . COLONOSCOPY  2012  .  ESOPHAGOGASTRODUODENOSCOPY    . NECK SURGERY     vocal cord surgery  . RE-EXCISION OF BREAST LUMPECTOMY  01/13/2017    Family History:  Family History  Problem Relation Age of Onset  . Breast cancer Sister 50  . Hypertension Mother   . Cancer Father        malignant  . Alcoholism Father   . Bladder Cancer Paternal Grandmother        malignant  . Brain cancer Paternal Grandfather        malignant  . Pancreatic cancer Paternal Aunt        25s  . Skin cancer Sister        non melanoma skin cancer    Social History:  Social History   Socioeconomic History  . Marital status: Married    Spouse name: Not on file  . Number of children: 1  . Years of education: Not on file  . Highest education level: Not on file  Occupational History  . Not on file  Social Needs  . Financial resource strain: Not on file  . Food insecurity:    Worry: Not on file    Inability: Not on file  . Transportation needs:    Medical: Not on file    Non-medical: Not on file  Tobacco Use  . Smoking status: Never Smoker  . Smokeless tobacco: Never Used  Substance and Sexual Activity  . Alcohol use: No  . Drug use: Never  . Sexual activity: Yes    Partners: Male  Lifestyle  . Physical activity:    Days per week: Not on file    Minutes per session: Not on file  . Stress: Not on file  Relationships  . Social connections:    Talks on phone: Not on file    Gets together: Not on file    Attends religious service: Not on file    Active member of club or organization: Not on file    Attends meetings of clubs or organizations: Not on file    Relationship status: Not on file  . Intimate partner violence:    Fear of current or ex partner: Not on file    Emotionally abused: Not on file    Physically abused: Not on file    Forced sexual activity: Not on file  Other Topics Concern  . Not on file  Social History Narrative  . Not on file    Allergies:  No Known Allergies  Medications:  Current  Outpatient Medications on File Prior to Visit  Medication Sig Dispense Refill  . albuterol (PROAIR HFA) 108 (90 Base) MCG/ACT inhaler 90 mcg/Actuation  prn as needed    . anastrozole (ARIMIDEX) 1 MG tablet Take by mouth.    Marland Kitchen atenolol (TENORMIN) 25 MG tablet Take 25 mg by mouth daily.  3  . cetirizine (ZYRTEC) 10 MG chewable tablet Chew 10 mg by mouth daily.    Marland Kitchen dicyclomine (BENTYL) 20 MG tablet TAKE 1 TABLET (20 MG TOTAL) BY MOUTH 2 (  TWO) TIMES DAILY AS NEEDED  1  . DULoxetine (CYMBALTA) 60 MG capsule Take by mouth.    . EPINEPHrine (EPIPEN 2-PAK) 0.3 mg/0.3 mL IJ SOAJ injection     . levothyroxine (SYNTHROID, LEVOTHROID) 88 MCG tablet Take 88 mcg by mouth daily.  5  . lidocaine-prilocaine (EMLA) cream APPLY TOPICALLY ONCE APPLY TO PORT 30 MINUTES TO 1 HOUR PRIOR TO PORT ACCESS.    Marland Kitchen LORazepam (ATIVAN) 1 MG tablet Take 1 mg by mouth at bedtime as needed. for sleep  0  . pantoprazole (PROTONIX) 40 MG tablet Take 40 mg by mouth daily.  3  . PAZEO 0.7 % SOLN INSTILL 1 DROP IN BOTH EYES DAILY FOR 14 DAYS AS NEEDED FOR ALLERGIES  2  . valACYclovir (VALTREX) 500 MG tablet TAKE 1 TABLET BY MOUTH TWICE A DAY X 3-5 DAYS AS NEEDED OUTBREAK 30 tablet 2  . zolmitriptan (ZOMIG) 5 MG tablet Take 1 tablet at onset of headache. May repeat after 2 hours until relief up to a maximum of 2 tablets in 24 hours as needed     No current facility-administered medications on file prior to visit.    Physical Exam Vitals: BP 110/70 (BP Location: Right Arm, Patient Position: Sitting, Cuff Size: Normal)   Pulse 82   Ht 5\' 4"  (1.626 m)   Wt 77.6 kg   BMI 29.35 kg/m   General: WF in NAD HEENT: normocephalic, anicteric Neck: no thyroid enlargement, no palpable nodules, no cervical lymphadenopathy  Pulmonary: No increased work of breathing, CTAB Cardiovascular: RRR, without murmur  Breast:  Right breast: scar at 9 o'clock healing well. Some changes in contour after surgery. No masses. No inflammation. No nipple  discharge. Left breast: soft, non tender, no masses, no skin or nipple changes, no nipple discharge. No axillary, infraclavicular or supraclavicular lymphadenopathy. Abdomen: Soft, non-tender, non-distended.  Umbilicus without lesions.  No hepatomegaly or masses palpable. No evidence of hernia. Genitourinary:  External: Normal external female genitalia.  Normal urethral meatus, normal Bartholin's and Skene's glands.    Vagina: some atrophic changes, no evidence of prolapse.    Cervix: no bleeding, non-tender  Uterus: Anteverted, normal size, shape, and consistency, mobile, and non-tender  Adnexa: No adnexal masses, non-tender  Rectal: deferred  Lymphatic: no evidence of inguinal lymphadenopathy Extremities: no edema, erythema, or tenderness Neurologic: Grossly intact Psychiatric: mood appropriate, affect full     Assessment: 58 y.o.  Postmenopausal exam S/p right breast cancer Vasomotor symptoms Osteopenia  Plan:   1) Breast cancer screening - recommend monthly self breast exam. Mammograms and MRIs per her oncologist/ breast surgeon.  2) Discussed calcium and vitamin D requirements. Encouraged taking vitamin D3 1000 IU daily  3) Cervical cancer screening - Pap was done. ASCCP guidelines and rational discussed.  Patient opts for yearly screening interval  4) DIscussed non hormonal treatment for vasomotor symptoms. Would like to try vitamin E 400 IU BID  5) Routine healthcare maintenance including cholesterol and diabetes screening managed by PCP   6) RTO in 1 year and prn.  Dalia Heading, CNM

## 2018-03-19 ENCOUNTER — Encounter: Payer: Self-pay | Admitting: Certified Nurse Midwife

## 2018-03-19 ENCOUNTER — Other Ambulatory Visit: Payer: Self-pay

## 2018-03-19 ENCOUNTER — Ambulatory Visit (INDEPENDENT_AMBULATORY_CARE_PROVIDER_SITE_OTHER): Payer: BC Managed Care – PPO | Admitting: Certified Nurse Midwife

## 2018-03-19 ENCOUNTER — Other Ambulatory Visit (HOSPITAL_COMMUNITY)
Admission: RE | Admit: 2018-03-19 | Discharge: 2018-03-19 | Disposition: A | Discharge status: Home / Self Care | Payer: BC Managed Care – PPO | Source: Ambulatory Visit | Attending: Certified Nurse Midwife | Admitting: Certified Nurse Midwife

## 2018-03-19 VITALS — BP 110/70 | HR 82 | Ht 64.0 in | Wt 171.0 lb

## 2018-03-19 DIAGNOSIS — Z01419 Encounter for gynecological examination (general) (routine) without abnormal findings: Secondary | ICD-10-CM | POA: Insufficient documentation

## 2018-03-19 DIAGNOSIS — R319 Hematuria, unspecified: Secondary | ICD-10-CM

## 2018-03-19 DIAGNOSIS — N951 Menopausal and female climacteric states: Secondary | ICD-10-CM

## 2018-03-19 DIAGNOSIS — M810 Age-related osteoporosis without current pathological fracture: Secondary | ICD-10-CM

## 2018-03-19 DIAGNOSIS — C50911 Malignant neoplasm of unspecified site of right female breast: Secondary | ICD-10-CM

## 2018-03-19 DIAGNOSIS — Z124 Encounter for screening for malignant neoplasm of cervix: Secondary | ICD-10-CM | POA: Diagnosis present

## 2018-03-19 DIAGNOSIS — M8580 Other specified disorders of bone density and structure, unspecified site: Secondary | ICD-10-CM

## 2018-03-19 LAB — POCT URINALYSIS DIPSTICK
APPEARANCE: NORMAL
Bilirubin, UA: NEGATIVE
GLUCOSE UA: NEGATIVE
Ketones, UA: NEGATIVE
LEUKOCYTES UA: NEGATIVE
Nitrite, UA: NEGATIVE
ODOR: NORMAL
PH UA: 6 (ref 5.0–8.0)
Protein, UA: NEGATIVE
Spec Grav, UA: 1.025 (ref 1.010–1.025)
UROBILINOGEN UA: 0.2 U/dL

## 2018-03-20 ENCOUNTER — Encounter: Payer: Self-pay | Admitting: Certified Nurse Midwife

## 2018-03-23 ENCOUNTER — Encounter: Payer: Self-pay | Admitting: Certified Nurse Midwife

## 2018-03-23 DIAGNOSIS — M8580 Other specified disorders of bone density and structure, unspecified site: Secondary | ICD-10-CM | POA: Insufficient documentation

## 2018-03-23 DIAGNOSIS — F411 Generalized anxiety disorder: Secondary | ICD-10-CM | POA: Insufficient documentation

## 2018-03-23 DIAGNOSIS — K219 Gastro-esophageal reflux disease without esophagitis: Secondary | ICD-10-CM | POA: Insufficient documentation

## 2018-03-23 DIAGNOSIS — K589 Irritable bowel syndrome without diarrhea: Secondary | ICD-10-CM | POA: Insufficient documentation

## 2018-03-23 DIAGNOSIS — I10 Essential (primary) hypertension: Secondary | ICD-10-CM | POA: Insufficient documentation

## 2018-03-23 DIAGNOSIS — J45909 Unspecified asthma, uncomplicated: Secondary | ICD-10-CM | POA: Insufficient documentation

## 2018-03-23 DIAGNOSIS — E7800 Pure hypercholesterolemia, unspecified: Secondary | ICD-10-CM | POA: Insufficient documentation

## 2018-03-23 DIAGNOSIS — E039 Hypothyroidism, unspecified: Secondary | ICD-10-CM | POA: Insufficient documentation

## 2018-03-23 DIAGNOSIS — M810 Age-related osteoporosis without current pathological fracture: Secondary | ICD-10-CM

## 2018-03-23 DIAGNOSIS — Z780 Asymptomatic menopausal state: Secondary | ICD-10-CM | POA: Insufficient documentation

## 2018-03-24 LAB — CYTOLOGY - PAP
DIAGNOSIS: NEGATIVE
HPV (WINDOPATH): NOT DETECTED

## 2018-06-06 ENCOUNTER — Telehealth: Payer: BC Managed Care – PPO | Admitting: Family

## 2018-06-06 DIAGNOSIS — Z20822 Contact with and (suspected) exposure to covid-19: Secondary | ICD-10-CM

## 2018-06-06 DIAGNOSIS — R6889 Other general symptoms and signs: Principal | ICD-10-CM

## 2018-06-06 MED ORDER — BENZONATATE 100 MG PO CAPS: 100.0000 mg | ORAL_CAPSULE | Freq: Three times a day (TID) | ORAL | 0 refills | Status: AC | PRN

## 2018-06-06 MED ORDER — ALBUTEROL SULFATE HFA 108 (90 BASE) MCG/ACT IN AERS: 2.0000 | INHALATION_SPRAY | Freq: Four times a day (QID) | RESPIRATORY_TRACT | 0 refills | Status: AC | PRN

## 2018-06-06 NOTE — Progress Notes (Signed)
E-Visit for Corona Virus Screening  Based on your current symptoms, you may very well have the virus, however your symptoms are mild. Currently, not all patients are being tested. If the symptoms are mild and there is not a known exposure, performing the test is not indicated.  Coronavirus disease 2019 (COVID-19) is a respiratory illness that can spread from person to person. The virus that causes COVID-19 is a new virus that was first identified in the country of [place] but is now found in multiple other countries and has spread to the [place].  Symptoms associated with the virus are mild to severe fever, cough, and shortness of breath. There is currently no vaccine to protect against COVID-19, and there is no specific antiviral treatment for the virus.   To be considered HIGH RISK for Coronavirus (COVID-19), you have to meet the following criteria:  . Traveled to [place], [place], [place], [place] or [place]; or in the [place] to [place], [place], [place], or [place]; and have fever, cough, and shortness of breath within the last 2 weeks of travel OR  . Been in close contact with a person diagnosed with COVID-19 within the last 2 weeks and have fever, cough, and shortness of breath  . IF YOU DO NOT MEET THESE CRITERIA, YOU ARE CONSIDERED LOW RISK FOR COVID-19.   It is vitally important that if you feel that you have an infection such as this virus or any other virus that you stay home and away from places where you may spread it to others.  You should self-quarantine for 14 days if you have symptoms that could potentially be coronavirus and avoid contact with people age 58 and older.   You can use medication such as A prescription cough medication called Tessalon Perles 100 mg. You may take 1-2 capsules every 8 hours as needed for cough and A prescription inhaler called Albuterol MDI 90 mcg /actuation 2 puffs every 4 hours as needed for shortness of breath, wheezing, cough  You may  also take acetaminophen (Tylenol) as needed for fever.   Reduce your risk of any infection by using the same precautions used for avoiding the common cold or flu:  Marland Kitchen Wash your hands often with soap and warm water for at least 20 seconds.  If soap and water are not readily available, use an alcohol-based hand sanitizer with at least 60% alcohol.  . If coughing or sneezing, cover your mouth and nose by coughing or sneezing into the elbow areas of your shirt or coat, into a tissue or into your sleeve (not your hands). . Avoid shaking hands with others and consider head nods or verbal greetings only. . Avoid touching your eyes, nose, or mouth with unwashed hands.  . Avoid close contact with people who are sick. . Avoid places or events with large numbers of people in one location, like concerts or sporting events. . Carefully consider travel plans you have or are making. . If you are planning any travel outside or inside the [place], visit the CDC's Travelers' Health webpage for the latest health notices. . If you have some symptoms but not all symptoms, continue to monitor at home and seek medical attention if your symptoms worsen. . If you are having a medical emergency, call 911.  HOME CARE . Only take medications as instructed by your medical team. . Drink plenty of fluids and get plenty of rest. . A steam or ultrasonic humidifier can help if you  have congestion.   GET HELP RIGHT AWAY IF: . You develop worsening fever. . You become short of breath . You cough up blood. . Your symptoms become more severe MAKE SURE YOU   Understand these instructions.  Will watch your condition.  Will get help right away if you are not doing well or get worse.  Your e-visit answers were reviewed by a board certified advanced clinical practitioner to complete your personal care plan.  Depending on the condition, your plan could have included both over the counter or prescription medications.  If there is a  problem please reply once you have received a response from your provider. Your safety is important to [place].  If you have drug allergies check your prescription carefully.    You can use MyChart to ask questions about today's visit, request a non-urgent call back, or ask for a work or school excuse for 24 hours related to this e-Visit. If it has been greater than 24 hours you will need to follow up with your provider, or enter a new e-Visit to address those concerns. You will get an e-mail in the next two days asking about your experience.  I hope that your e-visit has been valuable and will speed your recovery. Thank you for using e-visits.

## 2018-09-16 ENCOUNTER — Other Ambulatory Visit: Payer: Self-pay | Admitting: Certified Nurse Midwife

## 2018-12-21 IMAGING — NM NM HEPATO W/GB/PHARM/[PERSON_NAME]
3 series · 18 of 18 positions shown · non-contrast
Comparison: 04/10/2011

CLINICAL DATA: Lower abdominal pain into LEFT side, RIGHT upper
quadrant pain, belching, gas, nausea

EXAM:
NUCLEAR MEDICINE HEPATOBILIARY IMAGING WITH GALLBLADDER EF
TECHNIQUE: Sequential images of the abdomen were obtained [DATE] minutes
following intravenous administration of radiopharmaceutical. After
oral ingestion of Ensure, gallbladder ejection fraction was
determined. At 60 min, normal ejection fraction is greater than 33%.
RADIOPHARMACEUTICALS:  5.3 mCi 8c-IIm  Choletec IV

[Series 1000: gallbladder ef dynamic (results) · 4.80mm/px · 6 of 120 frames shown]
[frame 11/120]
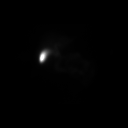
[frame 31/120]
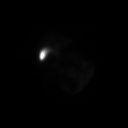
[frame 51/120]
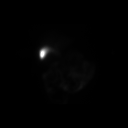
[frame 71/120]
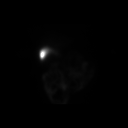
[frame 91/120]
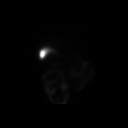
[frame 111/120]
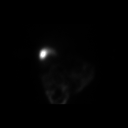

[Series 1000: gallbladder ef dynamic · 4.80mm/px · 6 of 120 frames shown]
[frame 11/120]
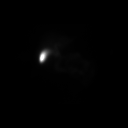
[frame 31/120]
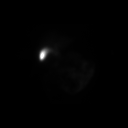
[frame 51/120]
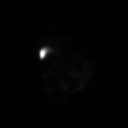
[frame 71/120]
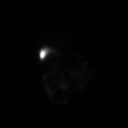
[frame 91/120]
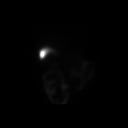
[frame 111/120]
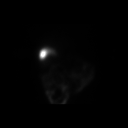

[Series 1000: hepatobiliary dynamic · 9.59mm/px · 6 of 60 frames shown]
[frame 6/60]
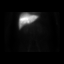
[frame 16/60]
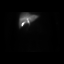
[frame 26/60]
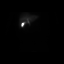
[frame 36/60]
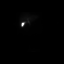
[frame 46/60]
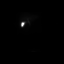
[frame 56/60]
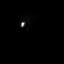

[18 of 18 positions shown; findings below may reference images not displayed]

FINDINGS: Normal tracer extraction from bloodstream indicating normal
hepatocellular function.

Normal excretion of tracer into biliary tree.

Gallbladder visualized at 15 min.

Small bowel visualized at 49 min.

No hepatic retention of tracer.

Subjectively normal emptying of tracer from gallbladder following
fatty meal stimulation.

Calculated gallbladder ejection fraction is 60%, normal.

Patient reported no symptoms following Ensure ingestion.

Normal gallbladder ejection fraction following Ensure ingestion is
greater than 33% at 1 hour.
IMPRESSION: Normal exam.

## 2019-02-04 ENCOUNTER — Ambulatory Visit: Payer: BC Managed Care – PPO | Attending: Radiation Oncology | Admitting: Radiation Oncology

## 2019-08-04 ENCOUNTER — Other Ambulatory Visit: Payer: Self-pay | Admitting: Certified Nurse Midwife
